# Patient Record
Sex: Male | Born: 1995 | Race: Black or African American | Hispanic: No | Marital: Single | Smoking: Former smoker
Health system: Southern US, Community
[De-identification: ages and names within clinical notes are randomized; demographics above are authoritative.]

## PROBLEM LIST (undated history)

## (undated) DIAGNOSIS — J45909 Unspecified asthma, uncomplicated: Secondary | ICD-10-CM

---

## 2018-09-26 ENCOUNTER — Ambulatory Visit: Payer: Self-pay | Admitting: Family Medicine

## 2018-10-01 ENCOUNTER — Ambulatory Visit: Payer: Self-pay | Admitting: Family Medicine

## 2018-10-15 ENCOUNTER — Ambulatory Visit: Payer: Self-pay | Admitting: Family Medicine

## 2019-01-17 ENCOUNTER — Encounter (HOSPITAL_COMMUNITY): Payer: Self-pay | Admitting: Emergency Medicine

## 2019-01-17 ENCOUNTER — Emergency Department (HOSPITAL_COMMUNITY)
Admission: EM | Admit: 2019-01-17 | Discharge: 2019-01-17 | Disposition: A | Discharge status: Home / Self Care | Payer: Managed Care, Other (non HMO) | Attending: Emergency Medicine | Admitting: Emergency Medicine

## 2019-01-17 ENCOUNTER — Emergency Department (HOSPITAL_COMMUNITY): Payer: Managed Care, Other (non HMO)

## 2019-01-17 DIAGNOSIS — J40 Bronchitis, not specified as acute or chronic: Secondary | ICD-10-CM | POA: Diagnosis not present

## 2019-01-17 DIAGNOSIS — R05 Cough: Secondary | ICD-10-CM | POA: Diagnosis present

## 2019-01-17 DIAGNOSIS — J4520 Mild intermittent asthma, uncomplicated: Secondary | ICD-10-CM | POA: Diagnosis not present

## 2019-01-17 DIAGNOSIS — J4540 Moderate persistent asthma, uncomplicated: Secondary | ICD-10-CM

## 2019-01-17 MED ORDER — ALBUTEROL SULFATE HFA 108 (90 BASE) MCG/ACT IN AERS
2.0000 | INHALATION_SPRAY | Freq: Once | RESPIRATORY_TRACT | Status: AC
  Administered 2019-01-17: 2 via RESPIRATORY_TRACT
  Filled 2019-01-17: qty 6.7

## 2019-01-17 MED ORDER — IPRATROPIUM-ALBUTEROL 0.5-2.5 (3) MG/3ML IN SOLN
3.0000 mL | Freq: Once | RESPIRATORY_TRACT | Status: AC
  Administered 2019-01-17: 3 mL via RESPIRATORY_TRACT
  Filled 2019-01-17: qty 3

## 2019-01-17 MED ORDER — ALBUTEROL SULFATE (2.5 MG/3ML) 0.083% IN NEBU
5.0000 mg | INHALATION_SOLUTION | Freq: Once | RESPIRATORY_TRACT | Status: AC
  Administered 2019-01-17: 5 mg via RESPIRATORY_TRACT
  Filled 2019-01-17: qty 6

## 2019-01-17 MED ORDER — PREDNISONE 20 MG PO TABS
20.0000 mg | ORAL_TABLET | Freq: Once | ORAL | Status: AC
  Administered 2019-01-17: 20 mg via ORAL
  Filled 2019-01-17: qty 1

## 2019-01-17 MED ORDER — PREDNISONE 10 MG PO TABS: 20.0000 mg | ORAL_TABLET | Freq: Every day | ORAL | 0 refills | Status: AC

## 2019-01-17 NOTE — ED Triage Notes (Signed)
Pt reports trouble breathing, not in respiratory distress, wheezes on left and right.  Also reports a productive cough

## 2019-01-17 NOTE — ED Provider Notes (Signed)
[facility] Provider Note  CSN: [id number] Arrival date & time: 01/17/19 0012  Chief Complaint(s) Cough and Shortness of Breath  HPI Joshua Rice is a 24 y.o. male   The history is provided by the patient.  Cough  Cough characteristics:  Productive Sputum characteristics:  Clear Severity:  Mild Onset quality:  Gradual Duration:  1 day Timing:  Intermittent Progression:  Waxing and waning Chronicity:  New Smoker: yes   Context: sick contacts   Relieved by:  Nothing Worsened by:  Nothing Ineffective treatments:  Beta-agonist inhaler Associated symptoms: shortness of breath   Associated symptoms: no chest pain, no chills and no fever   Shortness of Breath  Associated symptoms: cough   Associated symptoms: no chest pain and no fever     Past Medical History History reviewed. No pertinent past medical history. There are no active problems to display for this patient.  Home Medication(s) Prior to Admission medications   Medication Sig Start Date End Date Taking? Authorizing Provider  predniSONE (DELTASONE) 10 MG tablet Take 2 tablets (20 mg total) by mouth daily for 4 days. 01/18/19 01/22/19  Nira Connardama,  Eduardo, MD                                                                                                                                    Past Surgical History History reviewed. No pertinent surgical history. Family History No family history on file.  Social History Social History   Tobacco Use  . Smoking status: Not on file  Substance Use Topics  . Alcohol use: Not on file  . Drug use: Not on file   Allergies Patient has no allergy information on record.  Review of Systems Review of Systems  Constitutional: Negative for chills and fever.  Respiratory: Positive for cough and shortness of breath.   Cardiovascular: Negative for chest pain.   All other systems are reviewed and are negative for acute change except as  noted in the HPI  Physical Exam Vital Signs  I have reviewed the triage vital signs BP (!) 153/76   Pulse 87   Temp 97.6 F (36.4 C) (Oral)   Resp (!) 22   SpO2 98%   Physical Exam Vitals signs reviewed.  Constitutional:      General: He is not in acute distress.    Appearance: He is well-developed. He is not diaphoretic.  HENT:     Head: Normocephalic and atraumatic.     Jaw: No trismus.     Right Ear: External ear normal.     Left Ear: External ear normal.     Nose: Nose normal.  Eyes:     General: No scleral icterus.       Right eye: No discharge.        Left eye: No discharge.     Conjunctiva/sclera: Conjunctivae normal.     Pupils: Pupils are equal, round,  and reactive to light.  Neck:     Musculoskeletal: Normal range of motion and neck supple.     Trachea: Phonation normal.  Cardiovascular:     Rate and Rhythm: Normal rate and regular rhythm.     Heart sounds: No murmur. No friction rub. No gallop.   Pulmonary:     Effort: Pulmonary effort is normal. No respiratory distress.     Breath sounds: No stridor. Wheezing (diffuse insp and exp) present. No rales.  Abdominal:     General: There is no distension.     Palpations: Abdomen is soft.     Tenderness: There is no abdominal tenderness.  Musculoskeletal: Normal range of motion.        General: No tenderness.  Skin:    General: Skin is warm and dry.     Findings: No erythema or rash.  Neurological:     Mental Status: He is alert and oriented to person, place, and time.  Psychiatric:        Behavior: Behavior normal.     ED Results and Treatments Labs (all labs ordered are listed, but only abnormal results are displayed) Labs Reviewed - No data to display                                                                                                                       EKG  EKG Interpretation  Date/Time:    Ventricular Rate:    PR Interval:    QRS Duration:   QT Interval:    QTC Calculation:   R  Axis:     Text Interpretation:        Radiology Dg Chest 2 View  Result Date: 01/17/2019 CLINICAL DATA:  24 year old male with shortness of breath. EXAM: CHEST - 2 VIEW COMPARISON:  None. FINDINGS: Minimal peribronchial density involving the left suprahilar region as well as left perihilar streaky densities may represent developing infiltrate. Clinical correlation is recommended. No focal consolidation, pleural effusion, or pneumothorax. The cardiac silhouette is within normal limits. No acute osseous pathology. IMPRESSION: Possible developing infiltrate in the left lung. No focal consolidation. Electronically Signed   By: Elgie Collard M.D.   On: 01/17/2019 01:52   Pertinent labs & imaging results that were available during my care of the patient were reviewed by me and considered in my medical decision making (see chart for details).  Medications Ordered in ED Medications  albuterol (PROVENTIL HFA;VENTOLIN HFA) 108 (90 Base) MCG/ACT inhaler 2 puff (has no administration in time range)  albuterol (PROVENTIL) (2.5 MG/3ML) 0.083% nebulizer solution 5 mg (5 mg Nebulization Given 01/17/19 0037)  ipratropium-albuterol (DUONEB) 0.5-2.5 (3) MG/3ML nebulizer solution 3 mL (3 mLs Nebulization Given 01/17/19 0537)  predniSONE (DELTASONE) tablet 20 mg (20 mg Oral Given 01/17/19 0700)  Procedures Procedures  (including critical care time)  Medical Decision Making / ED Course I have reviewed the nursing notes for this encounter and the patient's prior records (if available in EHR or on provided paperwork).    Patient presents with shortness of breath and cough.  Wheezing noted on exam.  Chest x-ray consistent with viral process.  No focal infiltrates concerning for pneumonia.  Provided with breathing treatments and low-dose steroid.  On reevaluation, significant  improvement in air movement and resolve wheezing.  The patient appears reasonably screened and/or stabilized for discharge and I doubt any other medical condition or other [facility] requiring further screening, evaluation, or treatment in the ED at this time prior to discharge.  The patient is safe for discharge with strict return precautions.   Final Clinical Impression(s) / ED Diagnoses Final diagnoses:  Bronchitis  Moderate persistent reactive airway disease without complication    Disposition: Discharge  Condition: Good  I have discussed the results, Dx and Tx plan with the patient who expressed understanding and agree(s) with the plan. Discharge instructions discussed at great length. The patient was given strict return precautions who verbalized understanding of the instructions. No further questions at time of discharge.    ED Discharge Orders         Ordered    predniSONE (DELTASONE) 10 MG tablet  Daily     01/17/19 [id number]           Follow Up: No follow-up provider specified.    This chart was dictated using voice recognition software.  Despite best efforts to proofread,  errors can occur which can change the documentation meaning.   Nira Connardama,  Eduardo, MD 01/17/19 [phone]

## 2019-04-21 ENCOUNTER — Telehealth (INDEPENDENT_AMBULATORY_CARE_PROVIDER_SITE_OTHER): Payer: Managed Care, Other (non HMO) | Admitting: Registered Nurse

## 2019-04-21 ENCOUNTER — Encounter: Payer: Self-pay | Admitting: Registered Nurse

## 2019-04-21 ENCOUNTER — Other Ambulatory Visit: Payer: Self-pay

## 2019-04-21 VITALS — Ht 76.0 in | Wt 280.0 lb

## 2019-04-21 DIAGNOSIS — J4520 Mild intermittent asthma, uncomplicated: Secondary | ICD-10-CM | POA: Diagnosis not present

## 2019-04-21 DIAGNOSIS — J45909 Unspecified asthma, uncomplicated: Secondary | ICD-10-CM | POA: Insufficient documentation

## 2019-04-21 MED ORDER — ALBUTEROL SULFATE HFA 108 (90 BASE) MCG/ACT IN AERS: INHALATION_SPRAY | RESPIRATORY_TRACT | 0 refills | Status: DC

## 2019-04-21 NOTE — Progress Notes (Signed)
Spoke with pt this evening and he states he needs to establish a PCP to manage his Asthma. Pt states he has no other concerns at this time.   No travel outside the country No other healthcare issues

## 2019-04-21 NOTE — Patient Instructions (Signed)
Asthma, Adult    Asthma is a long-term (chronic) condition that causes recurrent episodes in which the airways become tight and narrow. The airways are the passages that lead from the nose and mouth down into the lungs. Asthma episodes, also called asthma attacks, can cause coughing, wheezing, shortness of breath, and chest pain. The airways can also fill with mucus. During an attack, it can be difficult to breathe. Asthma attacks can range from minor to life threatening.  Asthma cannot be cured, but medicines and lifestyle changes can help control it and treat acute attacks.  What are the causes?  This condition is believed to be caused by inherited (genetic) and environmental factors, but its exact cause is not known.  There are many things that can bring on an asthma attack or make asthma symptoms worse (triggers). Asthma triggers are different for each person. Common triggers include:   Mold.   Dust.   Cigarette smoke.   Cockroaches.   Things that can cause allergy symptoms (allergens), such as animal dander or pollen from trees or grass.   Air pollutants such as household cleaners, wood smoke, smog, or chemical odors.   Cold air, weather changes, and winds (which increase molds and pollen in the air).   Strong emotional expressions such as crying or laughing hard.   Stress.   Certain medicines (such as aspirin) or types of medicines (such as beta-blockers).   Sulfites in foods and drinks. Foods and drinks that may contain sulfites include dried fruit, potato chips, and sparkling grape juice.   Infections or inflammatory conditions such as the flu, a cold, or inflammation of the nasal membranes (rhinitis).   Gastroesophageal reflux disease (GERD).   Exercise or strenuous activity.  What are the signs or symptoms?  Symptoms of this condition may occur right after asthma is triggered or many hours later. Symptoms include:   Wheezing. This can sound like whistling when you breathe.   Excessive  nighttime or early morning coughing.   Frequent or severe coughing with a common cold.   Chest tightness.   Shortness of breath.   Tiredness (fatigue) with minimal activity.  How is this diagnosed?  This condition is diagnosed based on:   Your medical history.   A physical exam.   Tests, which may include:  ? Lung function studies and pulmonary studies (spirometry). These tests can evaluate the flow of air in your lungs.  ? Allergy tests.  ? Imaging tests, such as X-rays.  How is this treated?  There is no cure for this condition, but treatment can help control your symptoms. Treatment for asthma usually involves:   Identifying and avoiding your asthma triggers.   Using medicines to control your symptoms. Generally, two types of medicines are used to treat asthma:  ? Controller medicines. These help prevent asthma symptoms from occurring. They are usually taken every day.  ? Fast-acting reliever or rescue medicines. These quickly relieve asthma symptoms by widening the narrow and tight airways. They are used as needed and provide short-term relief.   Using supplemental oxygen. This may be needed during a severe episode.   Using other medicines, such as:  ? Allergy medicines, such as antihistamines, if your asthma attacks are triggered by allergens.  ? Immune medicines (immunomodulators). These are medicines that help control the immune system.   Creating an asthma action plan. An asthma action plan is a written plan for managing and treating your asthma attacks. This plan includes:  ? A list   of your asthma triggers and how to avoid them.  ? Information about when medicines should be taken and when their dosage should be changed.  ? Instructions about using a device called a peak flow meter. A peak flow meter measures how well the lungs are working and the severity of your asthma. It helps you monitor your condition.  Follow these instructions at home:  Controlling your home environment  Control your home  environment in the following ways to help avoid triggers and prevent asthma attacks:   Change your heating and air conditioning filter regularly.   Limit your use of fireplaces and wood stoves.   Get rid of pests (such as roaches and mice) and their droppings.   Throw away plants if you see mold on them.   Clean floors and dust surfaces regularly. Use unscented cleaning products.   Try to have someone else vacuum for you regularly. Stay out of rooms while they are being vacuumed and for a short while afterward. If you vacuum, use a dust mask from a hardware store, a double-layered or microfilter vacuum cleaner bag, or a vacuum cleaner with a HEPA filter.   Replace carpet with wood, tile, or vinyl flooring. Carpet can trap dander and dust.   Use allergy-proof pillows, mattress covers, and box spring covers.   Keep your bedroom a trigger-free room.   Avoid pets and keep windows closed when allergens are in the air.   Wash beddings every week in hot water and dry them in a dryer.   Use blankets that are made of polyester or cotton.   Clean bathrooms and kitchens with bleach. If possible, have someone repaint the walls in these rooms with mold-resistant paint. Stay out of the rooms that are being cleaned and painted.   Wash your hands often with soap and water. If soap and water are not available, use hand sanitizer.   Do not allow anyone to smoke in your home.  General instructions   Take over-the-counter and prescription medicines only as told by your health care provider.  ? Speak with your health care provider if you have questions about how or when to take the medicines.  ? Make note if you are requiring more frequent dosages.   Do not use any products that contain nicotine or tobacco, such as cigarettes and e-cigarettes. If you need help quitting, ask your health care provider. Also, avoid being exposed to secondhand smoke.   Use a peak flow meter as told by your health care provider. Record and  keep track of the readings.   Understand and use the asthma action plan to help minimize, or stop an asthma attack, without needing to seek medical care.   Make sure you stay up to date on your yearly vaccinations as told by your health care provider. This may include vaccines for the flu and pneumonia.   Avoid outdoor activities when allergen counts are high and when air quality is low.   Wear a ski mask that covers your nose and mouth during outdoor winter activities. Exercise indoors on cold days if you can.   Warm up before exercising, and take time for a cool-down period after exercise.   Keep all follow-up visits as told by your health care provider. This is important.  Where to find more information   For information about asthma, turn to the Centers for Disease Control and Prevention at www.cdc.gov/asthma/faqs.htm   For air quality information, turn to AirNow at https://airnow.gov/  Contact   a health care provider if:   You have wheezing, shortness of breath, or a cough even while you are taking medicine to prevent attacks.   The mucus you cough up (sputum) is thicker than usual.   Your sputum changes from clear or white to yellow, green, gray, or bloody.   Your medicines are causing side effects, such as a rash, itching, swelling, or trouble breathing.   You need to use a reliever medicine more than 2-3 times a week.   Your peak flow reading is still at 50-79% of your personal best after following your action plan for 1 hour.   You have a fever.  Get help right away if:   You are getting worse and do not respond to treatment during an asthma attack.   You are short of breath when at rest or when doing very little physical activity.   You have difficulty eating, drinking, or talking.   You have chest pain or tightness.   You develop a fast heartbeat or palpitations.   You have a bluish color to your lips or fingernails.   You are light-headed or dizzy, or you faint.   Your peak flow  reading is less than 50% of your personal best.   You feel too tired to breathe normally.  Summary   Asthma is a long-term (chronic) condition that causes recurrent episodes in which the airways become tight and narrow. These episodes can cause coughing, wheezing, shortness of breath, and chest pain.   Asthma cannot be cured, but medicines and lifestyle changes can help control it and treat acute attacks.   Make sure you understand how to avoid triggers and how and when to use your medicines.   Asthma attacks can range from minor to life threatening. Get help right away if you have an asthma attack and do not respond to treatment with your usual rescue medicines.  This information is not intended to replace advice given to you by your health care provider. Make sure you discuss any questions you have with your health care provider.  Document Released: 12/17/2005 Document Revised: 01/21/2017 Document Reviewed: 01/21/2017  Elsevier Interactive Patient Education  2019 [employer]

## 2019-04-21 NOTE — Progress Notes (Signed)
Telemedicine Encounter- SOAP NOTE Established Patient  This telephone encounter was conducted with the patient's (or proxy's) verbal consent via audio telecommunications: Yes Patient was instructed to have this encounter in a suitably private space; and to only have persons present to whom they give permission to participate. In addition, patient identity was confirmed by use of name plus two identifiers (DOB and address).  I discussed the limitations, risks, security and privacy concerns of performing an evaluation and management service by telephone and the availability of in person appointments. I also discussed with the patient that there may be a patient responsible charge related to this service. The patient expressed understanding and agreed to proceed.  I spent a total of 15 minutes talking with the patient or their proxy.  CC: Asthma, mild, intermittent  Subjective   Joshua Rice is a 24 y.o. established patient. Telephone visit today for  Pt states his asthma started over the preceding months, stating that he has gone to urgent care to address this. Based on note from 01/17/19, he was seen by urgent care and given an albuterol inhaler. Pt denies any additional medical hx, surgical hx.   Pt requested appt today to get a refill on his albuterol inhaler and to establish care with a PCP to follow his asthma.     Patient Active Problem List   Diagnosis Date Noted  . Asthma 04/21/2019    No past medical history on file.  Current Outpatient Medications  Medication Sig Dispense Refill  . albuterol (VENTOLIN HFA) 108 (90 Base) MCG/ACT inhaler INHALE 2 PUFFS BY MOUTH EVERY 4 TO 6 HOURS AS NEEDED     No current facility-administered medications for this visit.     No Known Allergies  Social History   Socioeconomic History  . Marital status: Single    Spouse name: Not on file  . Number of children: Not on file  . Years of education: Not on file  . Highest education level:  Not on file  Occupational History  . Not on file  Social Needs  . Financial resource strain: Not on file  . Food insecurity:    Worry: Not on file    Inability: Not on file  . Transportation needs:    Medical: Not on file    Non-medical: Not on file  Tobacco Use  . Smoking status: Former Smoker    Types: Cigarettes    Last attempt to quit: 03/24/2019    Years since quitting: 0.0  . Smokeless tobacco: Never Used  Substance and Sexual Activity  . Alcohol use: Yes    Alcohol/week: 1.0 standard drinks    Types: 1 Cans of beer per week    Frequency: Never    Comment: occasional  . Drug use: Yes    Types: Marijuana  . Sexual activity: Not on file  Lifestyle  . Physical activity:    Days per week: Not on file    Minutes per session: Not on file  . Stress: Not on file  Relationships  . Social connections:    Talks on phone: Not on file    Gets together: Not on file    Attends religious service: Not on file    Active member of club or organization: Not on file    Attends meetings of clubs or organizations: Not on file    Relationship status: Not on file  . Intimate partner violence:    Fear of current or ex partner: Not on file  Emotionally abused: Not on file    Physically abused: Not on file    Forced sexual activity: Not on file  Other Topics Concern  . Not on file  Social History Narrative   Location managerHVAC worker.     Review of Systems  Constitutional: Negative.   HENT: Negative.   Respiratory: Negative.   Cardiovascular: Negative.   Gastrointestinal: Negative.   Musculoskeletal: Negative.   Skin: Negative.     Objective   Vitals as reported by the patient: Today's Vitals   04/21/19 1445  Weight: 280 lb (127 kg)  Height: 6\' 4"  (1.93 m)  This was a telehealth visit.  Diagnoses and all orders for this visit:  Mild intermittent asthma without complication -     albuterol (VENTOLIN HFA) 108 (90 Base) MCG/ACT inhaler; INHALE 2 PUFFS BY MOUTH EVERY 4 TO 6 HOURS AS  NEEDED   Joshua Rice and I discussed his ongoing mild intermittent symptoms of asthma. I was able to send an albuterol inhaler to his pharmacy, and briefly discuss the following regarding his asthma symptoms:  Smoking can trigger asthma symptoms. Try to avoid second hand smoke if possible.  Continue to wear a mask at work.  Call with any further symptoms of asthma or an asthma exacerbation. This includes if your symptoms increase to more than 2-3x per week, if symptoms disrupt your sleep, or if symptoms disrupt normal daily function.   Please return to clinic in late July (or post COVID-19) for an appointment for spirometry. This will allow us to provide further evidence for the diagnosis of asthma, and give us a working baseline for your spirometry values.   I discussed the assessment and treatment plan with the patient. The patient was provided an opportunity to ask questions and all were answered. The patient agreed with the plan and demonstrated an understanding of the instructions.   The patient was advised to call back or seek an in-person evaluation if the symptoms worsen or if the condition fails to improve as anticipated.  I provided 15 minutes of non-face-to-face time during this encounter.  Janeece Ageeichard Silveria Botz, NP  [facility]

## 2019-05-29 ENCOUNTER — Other Ambulatory Visit: Payer: Self-pay | Admitting: Registered Nurse

## 2019-05-29 DIAGNOSIS — J4520 Mild intermittent asthma, uncomplicated: Secondary | ICD-10-CM

## 2019-05-29 NOTE — Telephone Encounter (Signed)
Pt called in about the medication. Please advise.

## 2019-07-27 ENCOUNTER — Ambulatory Visit: Payer: Managed Care, Other (non HMO) | Admitting: Registered Nurse

## 2020-04-16 ENCOUNTER — Ambulatory Visit: Payer: Self-pay | Attending: Internal Medicine

## 2020-04-16 DIAGNOSIS — Z23 Encounter for immunization: Secondary | ICD-10-CM

## 2020-04-16 NOTE — Progress Notes (Signed)
   Covid-19 Vaccination Clinic  Name:  Rondarius Kadrmas    MRN: [id number] DOB: 07/18/1995  04/16/2020  Mr. Buhl was observed post Covid-19 immunization for 15 minutes without incident. He was provided with Vaccine Information Sheet and instruction to access the V-Safe system.   Mr. Muckey was instructed to call 911 with any severe reactions post vaccine: Marland Kitchen Difficulty breathing  . Swelling of face and throat  . A fast heartbeat  . A bad rash all over body  . Dizziness and weakness   Immunizations Administered    Name Date Dose VIS Date Route   Pfizer COVID-19 Vaccine 04/16/2020 11:05 AM 0.3 mL 12/11/2019 Intramuscular   Manufacturer: [employer], [employer]   Lot: [id number]   NDC: [address]-7

## 2020-05-10 ENCOUNTER — Ambulatory Visit: Payer: Self-pay | Attending: Internal Medicine

## 2020-05-10 DIAGNOSIS — Z23 Encounter for immunization: Secondary | ICD-10-CM

## 2020-05-10 NOTE — Progress Notes (Signed)
   Covid-19 Vaccination Clinic  Name:  Clayborne Divis    MRN: [id number] DOB: February 26, 1995  05/10/2020  Mr. Difatta was observed post Covid-19 immunization for 15 minutes without incident. He was provided with Vaccine Information Sheet and instruction to access the V-Safe system.   Mr. Senn was instructed to call 911 with any severe reactions post vaccine: Marland Kitchen Difficulty breathing  . Swelling of face and throat  . A fast heartbeat  . A bad rash all over body  . Dizziness and weakness   Immunizations Administered    Name Date Dose VIS Date Route   Pfizer COVID-19 Vaccine 05/10/2020  8:25 AM 0.3 mL 02/24/2019 Intramuscular   Manufacturer: [employer], [employer]   Lot: [id number]   NDC: [address]-2

## 2020-07-01 ENCOUNTER — Other Ambulatory Visit: Payer: Self-pay | Admitting: Registered Nurse

## 2020-07-01 DIAGNOSIS — J4520 Mild intermittent asthma, uncomplicated: Secondary | ICD-10-CM

## 2020-07-16 ENCOUNTER — Other Ambulatory Visit: Payer: Self-pay

## 2020-07-16 ENCOUNTER — Encounter (HOSPITAL_COMMUNITY): Payer: Self-pay

## 2020-07-16 ENCOUNTER — Ambulatory Visit (HOSPITAL_COMMUNITY)
Admission: EM | Admit: 2020-07-16 | Discharge: 2020-07-16 | Disposition: A | Discharge status: Home / Self Care | Payer: BC Managed Care – PPO | Attending: Urgent Care | Admitting: Urgent Care

## 2020-07-16 DIAGNOSIS — Z87891 Personal history of nicotine dependence: Secondary | ICD-10-CM | POA: Diagnosis not present

## 2020-07-16 DIAGNOSIS — Z79899 Other long term (current) drug therapy: Secondary | ICD-10-CM | POA: Insufficient documentation

## 2020-07-16 DIAGNOSIS — Z20822 Contact with and (suspected) exposure to covid-19: Secondary | ICD-10-CM | POA: Diagnosis not present

## 2020-07-16 DIAGNOSIS — J4530 Mild persistent asthma, uncomplicated: Secondary | ICD-10-CM | POA: Diagnosis not present

## 2020-07-16 DIAGNOSIS — R0981 Nasal congestion: Secondary | ICD-10-CM

## 2020-07-16 DIAGNOSIS — J3089 Other allergic rhinitis: Secondary | ICD-10-CM | POA: Insufficient documentation

## 2020-07-16 DIAGNOSIS — R062 Wheezing: Secondary | ICD-10-CM

## 2020-07-16 DIAGNOSIS — J4520 Mild intermittent asthma, uncomplicated: Secondary | ICD-10-CM

## 2020-07-16 HISTORY — DX: Unspecified asthma, uncomplicated: J45.909

## 2020-07-16 LAB — SARS CORONAVIRUS 2 (TAT 6-24 HRS): SARS Coronavirus 2: NEGATIVE

## 2020-07-16 MED ORDER — METHYLPREDNISOLONE ACETATE 80 MG/ML IJ SUSP
80.0000 mg | Freq: Once | INTRAMUSCULAR | Status: AC
  Administered 2020-07-16: 80 mg via INTRAMUSCULAR

## 2020-07-16 MED ORDER — METHYLPREDNISOLONE ACETATE 80 MG/ML IJ SUSP
INTRAMUSCULAR | Status: AC
  Filled 2020-07-16: qty 1

## 2020-07-16 MED ORDER — CETIRIZINE HCL 10 MG PO TABS: 10.0000 mg | ORAL_TABLET | Freq: Every day | ORAL | 0 refills | Status: AC

## 2020-07-16 MED ORDER — MONTELUKAST SODIUM 10 MG PO TABS: 10.0000 mg | ORAL_TABLET | Freq: Every day | ORAL | 0 refills | Status: AC

## 2020-07-16 MED ORDER — ALBUTEROL SULFATE HFA 108 (90 BASE) MCG/ACT IN AERS: 2.0000 | INHALATION_SPRAY | Freq: Four times a day (QID) | RESPIRATORY_TRACT | 1 refills | Status: AC | PRN

## 2020-07-16 NOTE — ED Provider Notes (Signed)
[facility]   MRN: [id number] DOB: [birth year]  Subjective:   Joshua Rice is a 25 y.o. male presenting for 2 day hx of acute onset sinus congestion, pressure. Has asthma, needs a refill of his albuterol inhaler. Has had his typical wheezing. Would like info to a pulmonologist. Has tried Mucinex, DayQuil for his sx. Has had COVID vaccination completed in April 2021.   No current facility-administered medications for this encounter.  Current Outpatient Medications:    albuterol (VENTOLIN HFA) 108 (90 Base) MCG/ACT inhaler, INHALE 2 PUFFS BY MOUTH EVERY 4 TO 6 HOURS AS NEEDED, Disp: 6.7 g, Rfl: 0   No Known Allergies  Past Medical History:  Diagnosis Date   Asthma      History reviewed. No pertinent surgical history.  No family history on file.  Social History   Tobacco Use   Smoking status: Former Smoker    Types: Cigarettes    Quit date: 03/24/2019    Years since quitting: 1.3   Smokeless tobacco: Never Used  Vaping Use   Vaping Use: Never used  Substance Use Topics   Alcohol use: Yes    Alcohol/week: 1.0 standard drink    Types: 1 Cans of beer per week    Comment: occasional   Drug use: Yes    Types: Marijuana    Review of Systems  Constitutional: Negative for fever and malaise/fatigue.  HENT: Positive for congestion. Negative for ear pain, sinus pain and sore throat.   Eyes: Negative for discharge and redness.  Respiratory: Positive for wheezing. Negative for cough, hemoptysis and shortness of breath.   Cardiovascular: Negative for chest pain.  Gastrointestinal: Negative for abdominal pain, diarrhea, nausea and vomiting.  Genitourinary: Negative for dysuria, flank pain and hematuria.  Musculoskeletal: Negative for myalgias.  Skin: Negative for rash.  Neurological: Negative for dizziness, weakness and headaches.  Psychiatric/Behavioral: Negative for depression and substance abuse.     Objective:   Vitals: BP 120/74    Pulse 83    Temp 98.1  F (36.7 C) (Oral)    Resp 16    Ht 6\' 4"  (1.93 m)    Wt 290 lb (131.5 kg)    SpO2 100%    BMI 35.30 kg/m   Physical Exam Constitutional:      General: He is not in acute distress.    Appearance: Normal appearance. He is well-developed and normal weight. He is not ill-appearing, toxic-appearing or diaphoretic.  HENT:     Head: Normocephalic and atraumatic.     Right Ear: Tympanic membrane, ear canal and external ear normal. There is no impacted cerumen.     Left Ear: Tympanic membrane, ear canal and external ear normal. There is no impacted cerumen.     Nose: Congestion present. No rhinorrhea.     Mouth/Throat:     Mouth: Mucous membranes are moist.     Pharynx: Oropharynx is clear. No oropharyngeal exudate or posterior oropharyngeal erythema.  Eyes:     General: No scleral icterus.       Right eye: No discharge.        Left eye: No discharge.     Extraocular Movements: Extraocular movements intact.     Conjunctiva/sclera: Conjunctivae normal.     Pupils: Pupils are equal, round, and reactive to light.  Cardiovascular:     Rate and Rhythm: Normal rate and regular rhythm.     Heart sounds: Normal heart sounds. No murmur heard.  No friction rub. No gallop.   Pulmonary:  Effort: Pulmonary effort is normal. No respiratory distress.     Breath sounds: No stridor. Wheezing (diffuse throughout) present. No rhonchi or rales.  Musculoskeletal:     Cervical back: Normal range of motion and neck supple. No rigidity. No muscular tenderness.  Neurological:     General: No focal deficit present.     Mental Status: He is alert and oriented to person, place, and time.  Psychiatric:        Mood and Affect: Mood normal.        Behavior: Behavior normal.        Thought Content: Thought content normal.        Judgment: Judgment normal.      Assessment and Plan :   PDMP not reviewed this encounter.  1. Nasal congestion   2. Mild intermittent asthma without complication   3. Wheezing     4. Mild persistent asthma without complication   5. Allergic rhinitis due to other allergic trigger, unspecified seasonality     IM Depomedrol given diffuse wheezing, asthma. Refilled albuterol. Start Zyrtec, Singulair. Follow up with pulmonology. Counseled patient on nature of COVID-19 including modes of transmission, diagnostic testing, management and supportive care.  Offered scripts for symptomatic relief. COVID 19 testing is pending. Counseled patient on potential for adverse effects with medications prescribed/recommended today, ER and return-to-clinic precautions discussed, patient verbalized understanding.     Wallis Bamberg, PA-C 07/16/20 1441

## 2020-07-16 NOTE — ED Triage Notes (Signed)
Pt c/o nasal congestionx2 days. Pt denies any other symptoms. Pt states need a refill on albuterol inh.

## 2022-05-27 ENCOUNTER — Other Ambulatory Visit: Payer: Self-pay

## 2022-05-27 ENCOUNTER — Encounter (HOSPITAL_COMMUNITY): Payer: Self-pay

## 2022-05-27 ENCOUNTER — Emergency Department (HOSPITAL_COMMUNITY)
Admission: EM | Admit: 2022-05-27 | Discharge: 2022-05-28 | Disposition: A | Discharge status: Home / Self Care | Payer: BC Managed Care – PPO | Attending: Emergency Medicine | Admitting: Emergency Medicine

## 2022-05-27 DIAGNOSIS — Z87891 Personal history of nicotine dependence: Secondary | ICD-10-CM | POA: Diagnosis not present

## 2022-05-27 DIAGNOSIS — R0602 Shortness of breath: Secondary | ICD-10-CM | POA: Diagnosis present

## 2022-05-27 DIAGNOSIS — J4541 Moderate persistent asthma with (acute) exacerbation: Secondary | ICD-10-CM | POA: Insufficient documentation

## 2022-05-27 NOTE — ED Triage Notes (Signed)
Pt reports with [facility] and wheezing since today. Pt states that his inhaler ran out today.

## 2022-05-28 ENCOUNTER — Emergency Department (HOSPITAL_COMMUNITY): Payer: BC Managed Care – PPO

## 2022-05-28 LAB — CBC WITH DIFFERENTIAL/PLATELET
Abs Immature Granulocytes: 0.09 10*3/uL — ABNORMAL HIGH (ref 0.00–0.07)
Basophils Absolute: 0 10*3/uL (ref 0.0–0.1)
Basophils Relative: 0 %
Eosinophils Absolute: 0.2 10*3/uL (ref 0.0–0.5)
Eosinophils Relative: 2 %
HCT: 45 % (ref 39.0–52.0)
Hemoglobin: 15 g/dL (ref 13.0–17.0)
Immature Granulocytes: 1 %
Lymphocytes Relative: 10 %
Lymphs Abs: 1.5 10*3/uL (ref 0.7–4.0)
MCH: 29.1 pg (ref 26.0–34.0)
MCHC: 33.3 g/dL (ref 30.0–36.0)
MCV: 87.2 fL (ref 80.0–100.0)
Monocytes Absolute: 1.2 10*3/uL — ABNORMAL HIGH (ref 0.1–1.0)
Monocytes Relative: 8 %
Neutro Abs: 11.1 10*3/uL — ABNORMAL HIGH (ref 1.7–7.7)
Neutrophils Relative %: 79 %
Platelets: 305 10*3/uL (ref 150–400)
RBC: 5.16 MIL/uL (ref 4.22–5.81)
RDW: 12.9 % (ref 11.5–15.5)
WBC: 14.1 10*3/uL — ABNORMAL HIGH (ref 4.0–10.5)
nRBC: 0 % (ref 0.0–0.2)

## 2022-05-28 LAB — COMPREHENSIVE METABOLIC PANEL
ALT: 31 U/L (ref 0–44)
AST: 19 U/L (ref 15–41)
Albumin: 4.5 g/dL (ref 3.5–5.0)
Alkaline Phosphatase: 56 U/L (ref 38–126)
Anion gap: 10 (ref 5–15)
BUN: 7 mg/dL (ref 6–20)
CO2: 28 mmol/L (ref 22–32)
Calcium: 9.4 mg/dL (ref 8.9–10.3)
Chloride: 102 mmol/L (ref 98–111)
Creatinine, Ser: 1.16 mg/dL (ref 0.61–1.24)
GFR, Estimated: >60 mL/min (ref >60)
Glucose, Bld: 124 mg/dL — ABNORMAL HIGH (ref 70–99)
Potassium: 3.6 mmol/L (ref 3.5–5.1)
Sodium: 140 mmol/L (ref 135–145)
Total Bilirubin: 0.9 mg/dL (ref 0.3–1.2)
Total Protein: 8.7 g/dL — ABNORMAL HIGH (ref 6.5–8.1)

## 2022-05-28 MED ORDER — METHYLPREDNISOLONE SODIUM SUCC 125 MG IJ SOLR: 125.0000 mg | Freq: Every day | INTRAMUSCULAR | Status: DC

## 2022-05-28 MED ORDER — ALBUTEROL SULFATE HFA 108 (90 BASE) MCG/ACT IN AERS
2.0000 | INHALATION_SPRAY | Freq: Once | RESPIRATORY_TRACT | Status: AC
  Administered 2022-05-28: 2 via RESPIRATORY_TRACT
  Filled 2022-05-28: qty 6.7

## 2022-05-28 MED ORDER — IPRATROPIUM-ALBUTEROL 0.5-2.5 (3) MG/3ML IN SOLN
3.0000 mL | Freq: Once | RESPIRATORY_TRACT | Status: AC
  Administered 2022-05-28: 3 mL via RESPIRATORY_TRACT
  Filled 2022-05-28: qty 3

## 2022-05-28 MED ORDER — PREDNISONE 20 MG PO TABS: 20.0000 mg | ORAL_TABLET | Freq: Every day | ORAL | 0 refills | Status: AC

## 2022-05-28 MED ORDER — ALBUTEROL SULFATE HFA 108 (90 BASE) MCG/ACT IN AERS: 2.0000 | INHALATION_SPRAY | RESPIRATORY_TRACT | Status: DC | PRN

## 2022-05-28 MED ORDER — MAGNESIUM SULFATE IN D5W 1-5 GM/100ML-% IV SOLN
1.0000 g | Freq: Once | INTRAVENOUS | Status: AC
  Administered 2022-05-28: 1 g via INTRAVENOUS
  Filled 2022-05-28: qty 100

## 2022-05-28 NOTE — Discharge Instructions (Signed)
You were seen in the emergency room today with wheezing and shortness of breath.  I am refilling your albuterol inhaler and have sent you home with a course of steroid.  Please follow with your primary care doctor in the coming week.  Return with any new or suddenly worsening symptoms.

## 2022-05-28 NOTE — ED Provider Notes (Signed)
Emergency Department Provider Note   I have reviewed the triage vital signs and the nursing notes.   HISTORY  Chief Complaint Shortness of Breath   HPI Joshua Rice is a 27 y.o. male with PMH of asthma presents to the emergency department for evaluation of shortness of breath with wheezing.  Symptoms been developing over the past 24 hours.  He attempted using his MDI at home but states that he gave him 1 to 2 puffs of medication and then ran out.  He is not having unusual chest pain/pressure.  He has some occasional chest tightness that is typical of his asthma.  No fevers or chills.  No vomiting.  No abdominal pain.   Past Medical History:  Diagnosis Date   Asthma     Review of Systems  Constitutional: No fever/chills Eyes: No visual changes. ENT: No sore throat. Cardiovascular: Denies chest pain. Respiratory: Positive shortness of breath and wheezing.  Gastrointestinal: No abdominal pain.  No nausea, no vomiting.  No diarrhea.  No constipation. Genitourinary: Negative for dysuria. Musculoskeletal: Negative for back pain. Skin: Negative for rash. Neurological: Negative for headaches, focal weakness or numbness.   ____________________________________________   PHYSICAL EXAM:  VITAL SIGNS: ED Triage Vitals  Enc Vitals Group     BP 05/27/22 2355 (!) 144/82     Pulse Rate 05/27/22 2355 (!) 137     Resp 05/27/22 2355 (!) 22     Temp 05/27/22 2355 98.1 F (36.7 C)     Temp Source 05/27/22 2355 Oral     SpO2 05/27/22 2355 100 %     Weight 05/27/22 2355 300 lb (136.1 kg)     Height 05/27/22 2355 6\' 4"  (1.93 m)   Constitutional: Alert and oriented. Able to provide a history but some increased WOB noted on arrival.  Eyes: Conjunctivae are normal.  Head: Atraumatic. Nose: No congestion/rhinnorhea. Mouth/Throat: Mucous membranes are moist.   Neck: No stridor.   Cardiovascular: Tachycardia. Good peripheral circulation. Grossly normal heart sounds.   Respiratory:  Increased respiratory effort.  No retractions. Lungs with bilateral end-expiratory wheezing. Good air entry.  Gastrointestinal: Soft and nontender. No distention.  Musculoskeletal: No gross deformities of extremities. Neurologic:  Normal speech and language.  Skin:  Skin is warm, dry and intact. No rash noted.   ____________________________________________   LABS (all labs ordered are listed, but only abnormal results are displayed)  Labs Reviewed  COMPREHENSIVE METABOLIC PANEL - Abnormal; Notable for the following components:      Result Value   Glucose, Bld 124 (*)    Total Protein 8.7 (*)    All other components within normal limits  CBC WITH DIFFERENTIAL/PLATELET - Abnormal; Notable for the following components:   WBC 14.1 (*)    Neutro Abs 11.1 (*)    Monocytes Absolute 1.2 (*)    Abs Immature Granulocytes 0.09 (*)    All other components within normal limits   ____________________________________________  EKG   EKG Interpretation  Date/Time:  Monday May 28 2022 00:00:45 EDT Ventricular Rate:  128 PR Interval:  144 QRS Duration: 82 QT Interval:  280 QTC Calculation: 409 R Axis:   86 Text Interpretation: Sinus tachycardia Prominent P waves, nondiagnostic Borderline T wave abnormalities Borderline ST elevation, lateral leads Confirmed by Alona BeneLong, Etheline Geppert [phone]([id number]) on 05/28/2022 12:08:38 AM        ____________________________________________  RADIOLOGY  DG Chest 2 View  Result Date: 05/28/2022 CLINICAL DATA:  Shortness of breath, wheezing EXAM: CHEST - 2 VIEW COMPARISON:  01/17/2019 FINDINGS: The heart size and mediastinal contours are within normal limits. Both lungs are clear. The visualized skeletal structures are unremarkable. IMPRESSION: Normal study. Electronically Signed   By: Charlett Nose M.D.   On: 05/28/2022 00:51    ____________________________________________   PROCEDURES  Procedure(s) performed:   Procedures  CRITICAL CARE Performed by: Maia Plan Total critical care time: 35 minutes Critical care time was exclusive of separately billable procedures and treating other patients. Critical care was necessary to treat or prevent imminent or life-threatening deterioration. Critical care was time spent personally by me on the following activities: development of treatment plan with patient and/or surrogate as well as nursing, discussions with consultants, evaluation of patient's response to treatment, examination of patient, obtaining history from patient or surrogate, ordering and performing treatments and interventions, ordering and review of laboratory studies, ordering and review of radiographic studies, pulse oximetry and re-evaluation of patient's condition.  Alona Bene, MD Emergency Medicine  ____________________________________________   INITIAL IMPRESSION / ASSESSMENT AND PLAN / ED COURSE  Pertinent labs & imaging results that were available during my care of the patient were reviewed by me and considered in my medical decision making (see chart for details).   This patient is Presenting for Evaluation of SOB, which does require a range of treatment options, and is a complaint that involves a high risk of morbidity and mortality.  The Differential Diagnoses include asthma exacerbation, PE, ACS, CHF, CAP, viral process.  Critical Interventions-    Medications  methylPREDNISolone sodium succinate (SOLU-MEDROL) 125 mg/2 mL injection 125 mg (has no administration in time range)  magnesium sulfate IVPB 1 g 100 mL (0 g Intravenous Stopped 05/28/22 0203)  ipratropium-albuterol (DUONEB) 0.5-2.5 (3) MG/3ML nebulizer solution 3 mL (3 mLs Nebulization Given 05/28/22 0026)  ipratropium-albuterol (DUONEB) 0.5-2.5 (3) MG/3ML nebulizer solution 3 mL (3 mLs Nebulization Given 05/28/22 0131)  albuterol (VENTOLIN HFA) 108 (90 Base) MCG/ACT inhaler 2 puff (2 puffs Inhalation Given 05/28/22 0203)    Reassessment after intervention: Wheezing  improved and WOB decreased.    I decided to review pertinent External Data, and in summary last visit in our system was visit to [facility] in 2021 for mild asthma exacerbation and MDI refill.   Clinical Laboratory Tests Ordered, included no significant anemia.  No acute kidney injury.  Normal LFTs.   Radiologic Tests Ordered, included CXR. I independently interpreted the images and agree with radiology interpretation.   Cardiac Monitor Tracing which shows sinus tachycardia.   Social Determinants of Health Risk former smoker, quit in 2020.   Medical Decision Making: Summary:  Patient presents to the emergency department with shortness of breath and wheezing on exam.  Seems consistent with asthma exacerbation.  No hypoxemia.  Tachycardia likely related to some increased work of breathing along with multiple attempted doses of albuterol at home.  Lower clinical suspicion for PE.  Do plan for chest x-ray along with labs, steroid, magnesium, DuoNeb and will reassess response to treatment.   Reevaluation with update and discussion with patient.  Lab work and chest x-ray reviewed.  On reassessment, patient has no significant increased work of breathing on my assessment.  He continues to have some mild wheezing although improved from my initial assessment.  We discussed continuing steroid for the next several days.  He will be discharged home with an MDI in hand.  Discussed tricked ED return precautions.  Patient is feeling better and comfortable with the plan at discharge.  Considered admission but given good  response to treatment here will d/c with strict Ed return precautions.   Disposition: discharge  ____________________________________________  FINAL CLINICAL IMPRESSION(S) / ED DIAGNOSES  Final diagnoses:  Moderate persistent asthma with exacerbation     NEW OUTPATIENT MEDICATIONS STARTED DURING THIS VISIT:  New Prescriptions   PREDNISONE (DELTASONE) 20 MG TABLET    Take 1 tablet (20 mg  total) by mouth daily for 4 days.    Note:  This document was prepared using Dragon voice recognition software and may include unintentional dictation errors.  Alona Bene, MD, [facility] Emergency Medicine    Yuritzi Kamp, Arlyss Repress, MD 05/28/22 [id number]

## 2022-05-28 NOTE — ED Notes (Signed)
Pt states understanding of dc instructions, importance of follow up. Pt denies questions or concerns and declined transportation assistance upon dc. Pt ambulated w/ a steady gait w/o need for assistance. No belongings left in room upon dc. ? ?

## 2023-05-16 IMAGING — CR DG CHEST 2V
2 series · 2 of 2 positions shown · non-contrast
Comparison: 01/17/2019

CLINICAL DATA: Shortness of breath, wheezing

EXAM:
CHEST - 2 VIEW

[w chest pa]
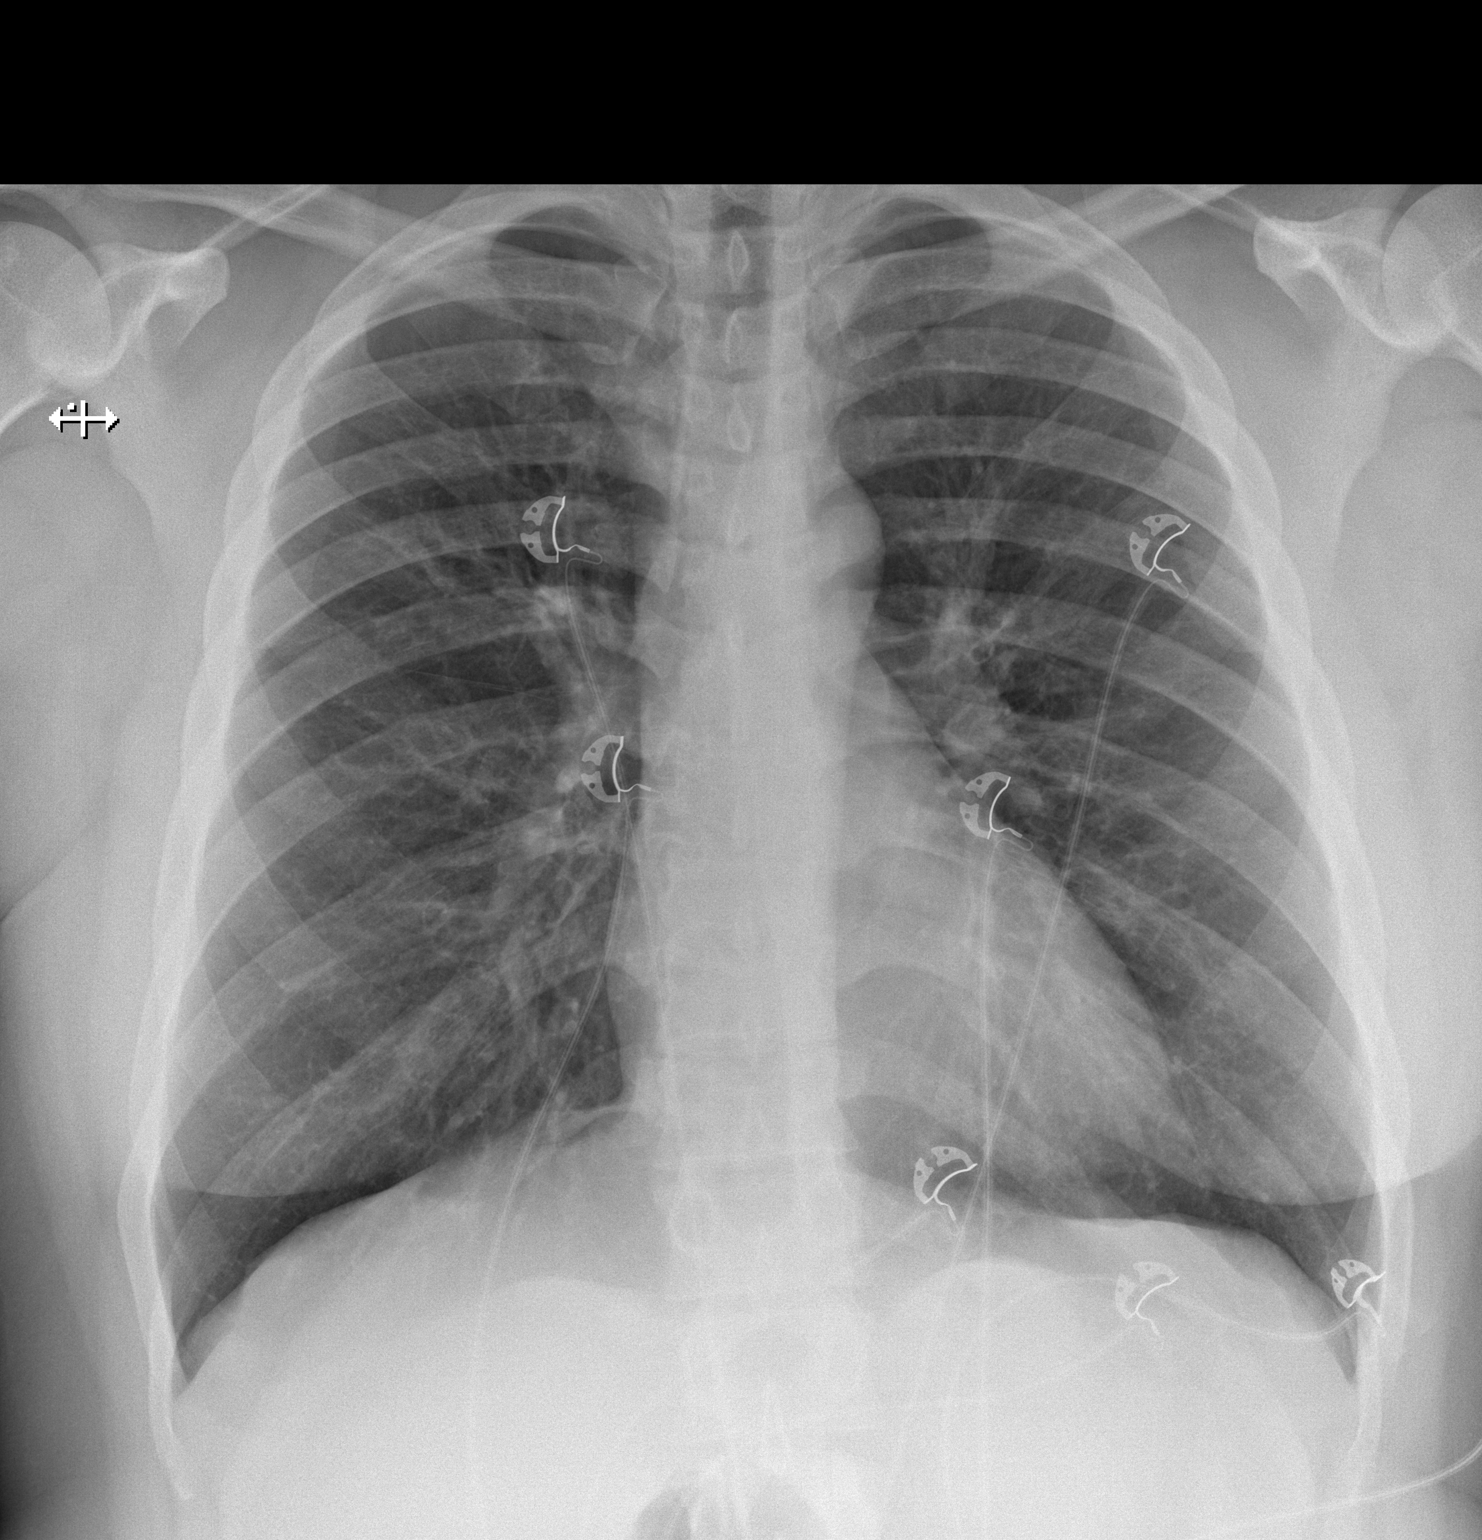

[w chest lat]
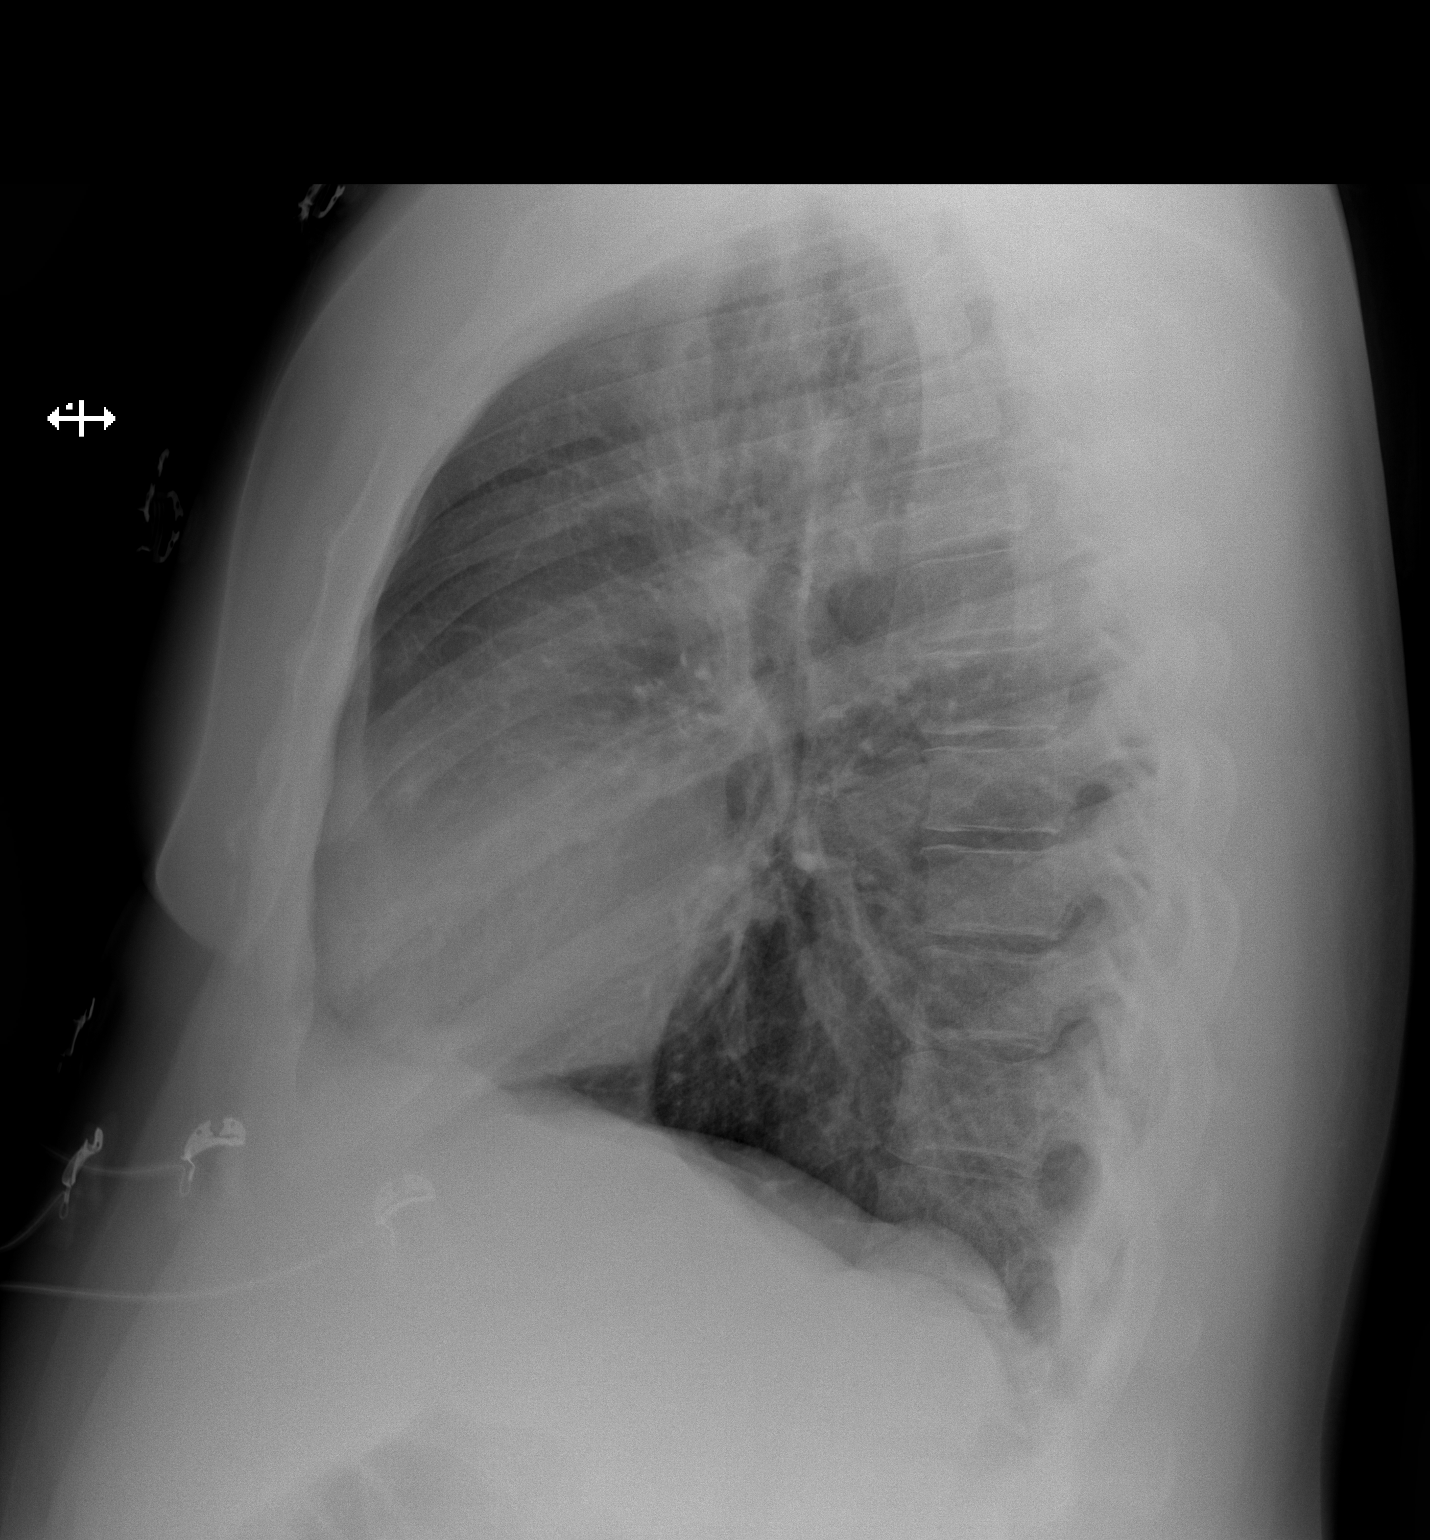

[2 of 2 positions shown; findings below may reference images not displayed]

FINDINGS: The heart size and mediastinal contours are within normal limits.
Both lungs are clear. The visualized skeletal structures are
unremarkable.
IMPRESSION: Normal study.
